# Patient Record
Sex: Male | Born: 1935 | Race: White | Hispanic: No | Marital: Married
Health system: Southern US, Community
[De-identification: ages and names within clinical notes are randomized; demographics above are authoritative.]

---

## 2011-08-29 ENCOUNTER — Ambulatory Visit: Payer: Self-pay | Admitting: Internal Medicine

## 2011-09-20 ENCOUNTER — Inpatient Hospital Stay: Payer: Self-pay | Admitting: Specialist

## 2011-09-29 ENCOUNTER — Ambulatory Visit: Payer: Self-pay | Admitting: Internal Medicine

## 2011-10-30 DEATH — deceased

## 2013-03-30 IMAGING — CT CT HEAD WITHOUT CONTRAST
2 series · 15 of 30 positions shown, 19 images · non-contrast
Comparison: none

REASON FOR EXAM: fall conyusion head
COMMENTS:

[Series 2: without · axial · non-contrast · 0.42mm/px · z∈[+471,+591]mm · 13 of 29 slices shown, 17 images]
[im 3/29  brain]
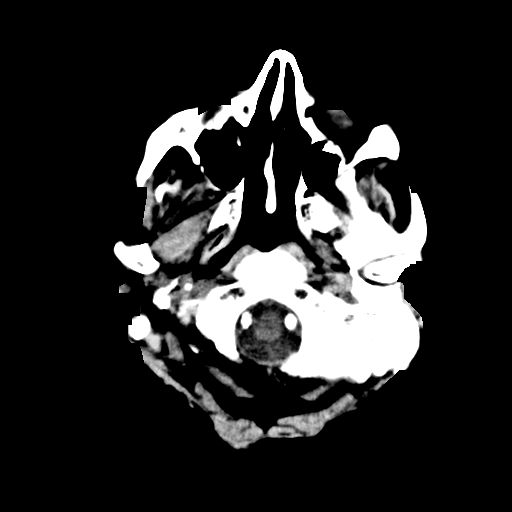
[im 3/29  bone]
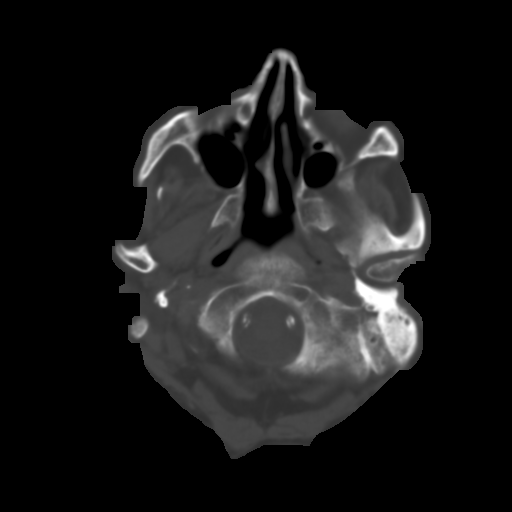
[im 5/29  brain]
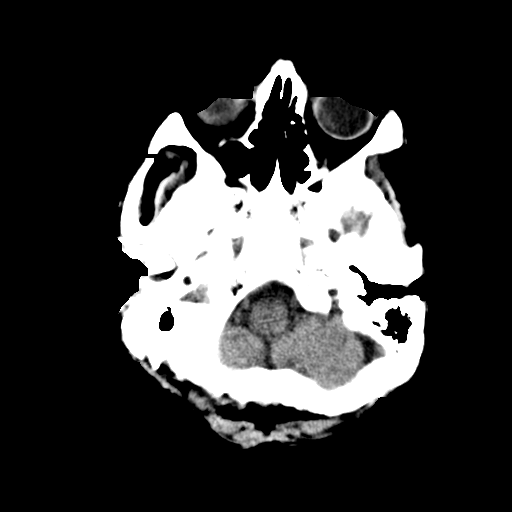
[im 7/29  brain]
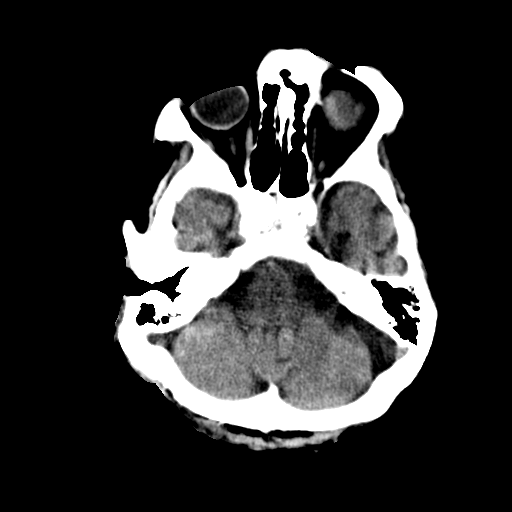
[im 9/29  brain]
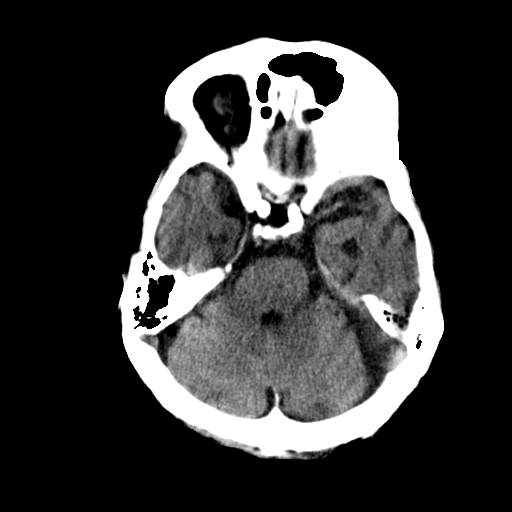
[im 11/29  brain]
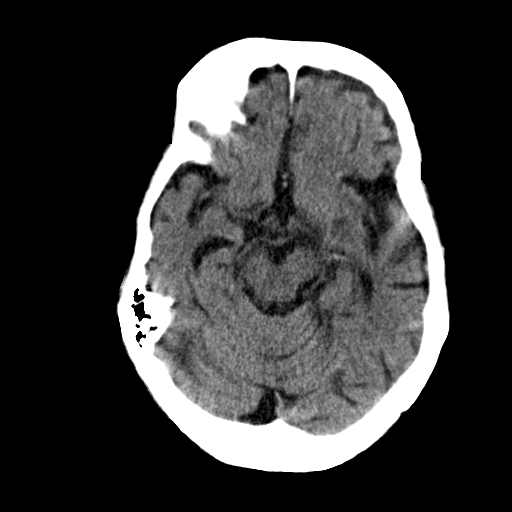
[im 11/29  bone]
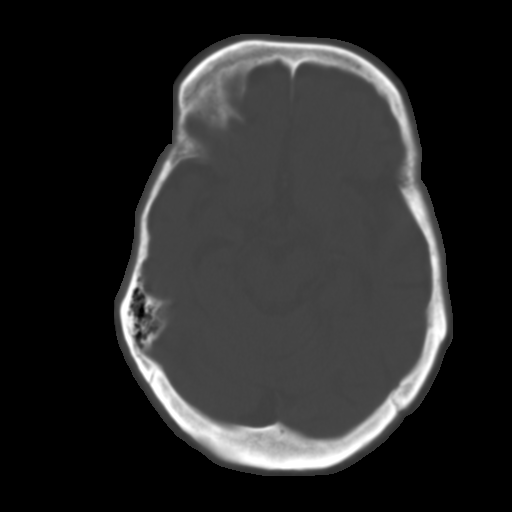
[im 13/29  brain]
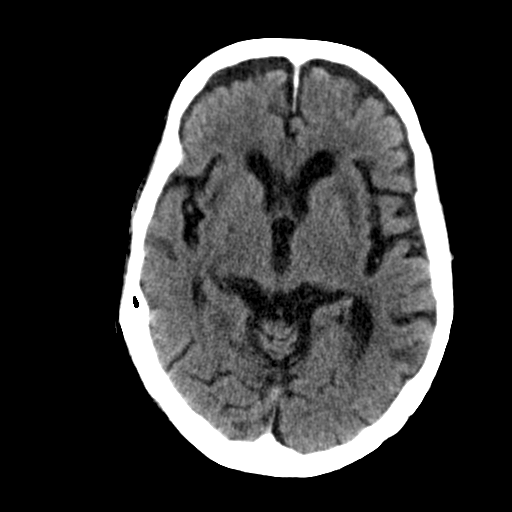
[im 15/29  brain]
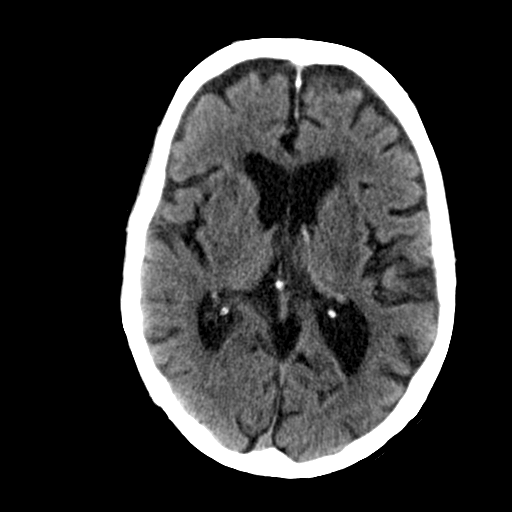
[im 17/29  brain]
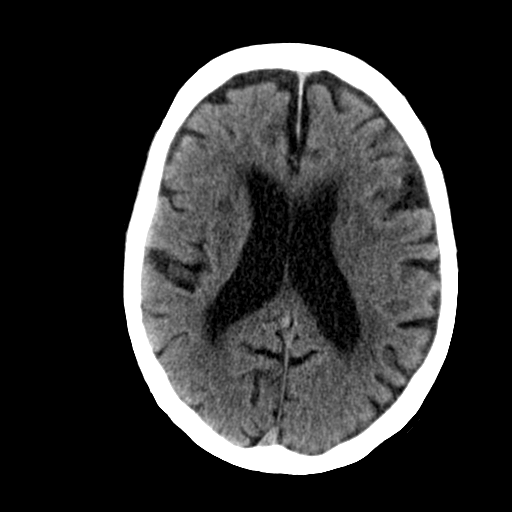
[im 19/29  brain]
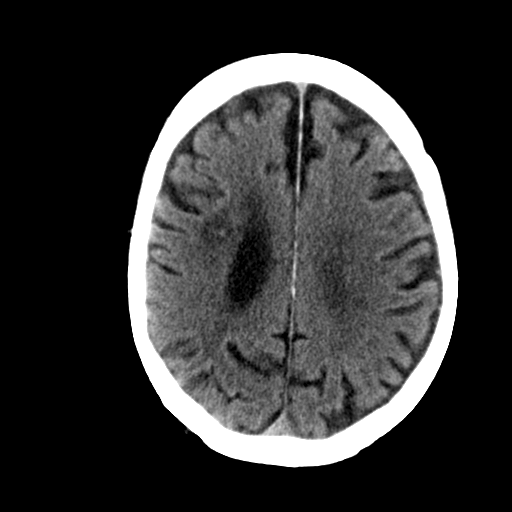
[im 19/29  bone]
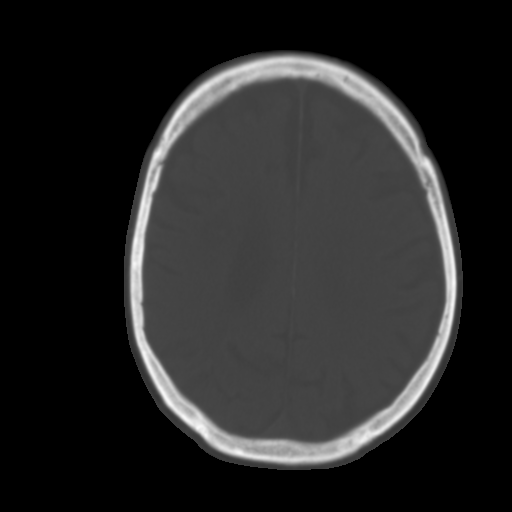
[im 21/29  brain]
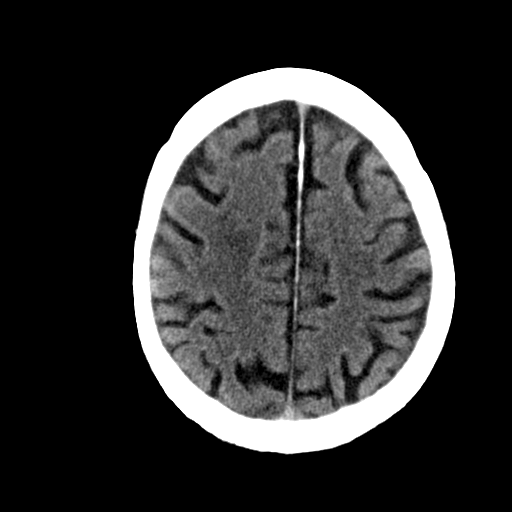
[im 23/29  brain]
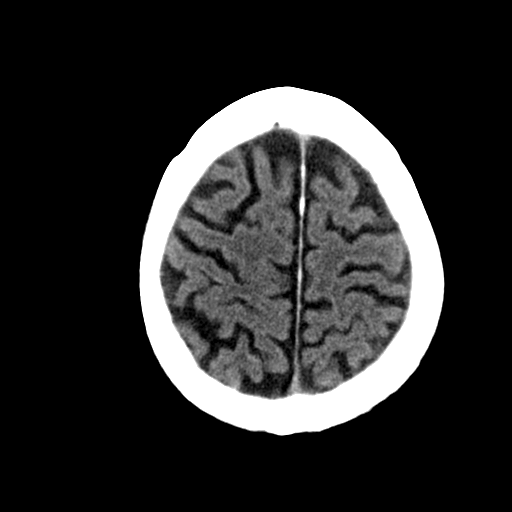
[im 25/29  brain]
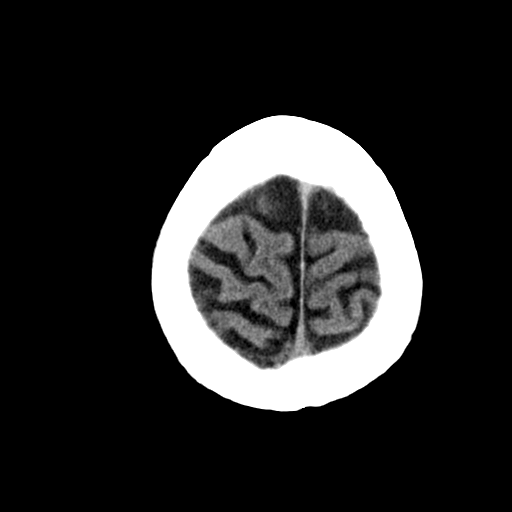
[im 27/29  brain]
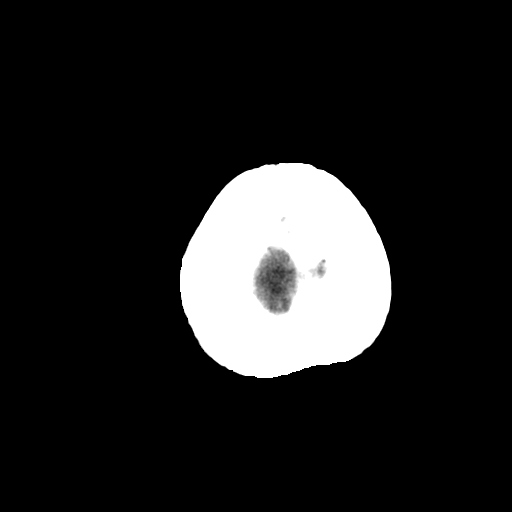
[im 27/29  bone]
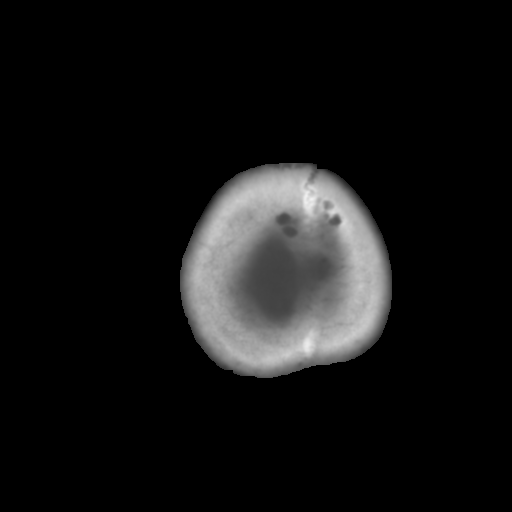

[Series 3: bone · axial · 0.42mm/px · z∈[+471,+491]mm · 2 of 29 slices shown]
[im 3/29  bone]
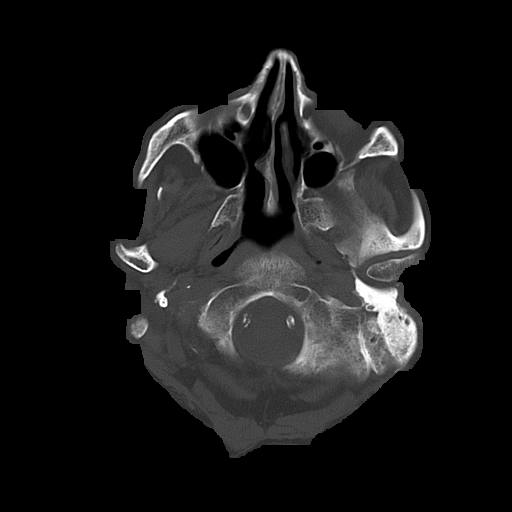
[im 7/29  bone]
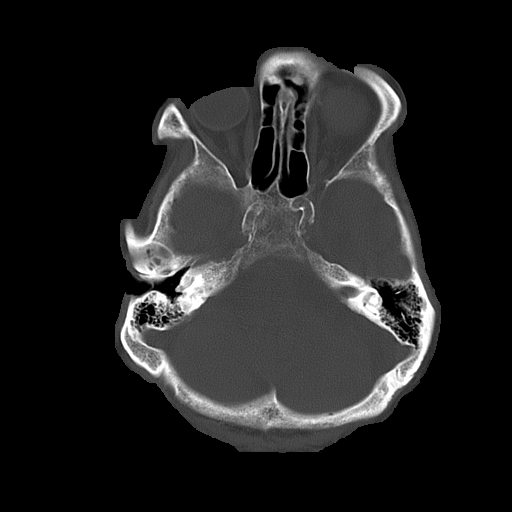

[15 of 30 positions shown; findings below may reference images not displayed]

PROCEDURE:     CT  - CT HEAD WITHOUT CONTRAST  - September 20, 2011  [DATE]

RESULT:     Axial noncontrast CT scanning was performed through the brain
with reconstructions at 5 mm intervals and slice thicknesses.

There is diffuse cerebral and cerebellar atrophy with compensatory
ventriculomegaly. There is no evidence of an acute intracranial hemorrhage.
There is decreased density in the deep white matter of both cerebral
hemispheres, but I do not see evidence of an acute ischemic event. The
cerebellum and brainstem exhibit no acute abnormality. At bone window
settings the observed portions of the paranasal sinuses and mastoid air
cells are clear. There is no evidence of an acute skull fracture.
IMPRESSION: I see no acute intracranial hemorrhage nor objective
evidence of an acute ischemic event. There is decreased density in the deep
white matter of both cerebral hemispheres consistent with chronic small
vessel ischemic type change. This has been described previously on an MRI in
1116. There are extensive chronic changes present. Followup CT scanning or
MRI would be useful if the patient's symptoms persist and remain unexplained.

## 2015-01-20 NOTE — Discharge Summary (Signed)
PATIENT NAME:  Terrence KennerSHE, Fermon H MR#:  [id number] DATE OF BIRTH:  05-17-1911  DATE OF ADMISSION:  09/20/2011 DATE OF DISCHARGE:  09/25/2011  For a detailed note, please see the history and physical on admission.   DIAGNOSES AT DISCHARGE:  1. Hypernatremia secondary to poor p.o. intake.  2. Adult failure to thrive.  3. Dementia.  4. Elevated troponin consistent with a non-ST-elevation myocardial infarction.   CONSULTANT DURING THE HOSPITAL COURSE: Dr. Harriett SineNancy Phifer from Palliative Care   PERTINENT STUDIES DONE DURING THE HOSPITAL COURSE:  1. CT of the head done without contrast on admission showing no acute intracranial hemorrhage. No evidence of any acute ischemic event. 2. Chest x-ray done on admission showing findings consistent with developing pneumonia in the right lower lobe.  3. Echocardiogram done showing an ejection fraction of 25 to 35%, left ventricular systolic function to be moderately severely reduced.   HOSPITAL COURSE: This is a 81 year old male with medical problems as mentioned above who presented to the hospital on December 23rd secondary to weakness, cough, and poor p.o. intake and noted to have a pneumonia. He was also noted to have elevated cardiac markers consistent with non-ST elevation MI.  1. Pneumonia. The patient was admitted to the hospital, started on IV Levaquin, also maintained on some p.r.n. nebulizer treatments. His blood cultures and sputum cultures were negative.  2. Non-ST elevation MI. The patient presented to the hospital with elevated troponin as high as 1.1. It went up to as high as 1.3. He did not present with any chest pain. He had no acute EKG changes. Given his advanced age, he was managed medically with some aspirin, beta-blockers, and statin. Echocardiogram was obtained which showed LV dysfunction, EF of 25 to 35%. He did not have any clinical evidence of heart failure during the hospital course.  3. Dysphagia and poor p.o. intake. Given his advanced age,  a speech consult was obtained. They evaluated the patient and they recommended that the patient was high risk for aspiration and would not recommend feeding him. They did recommend that the family pursue a feeding tube. Given the fact that the patient was 81 years old and had worsening failure to thrive and underlying dementia, Palliative Care consult was obtained to discuss goals of care with the family. Once the patient's family had a discussion with Palliative Care, they did not want any further aggressive measures and the patient was made COMFORT CARE only. Subsequently, the patient was made appropriate for transfer to the [facility]ospice home which is where he was transferred to on 09/25/2011 for further care.   TIME SPENT: 40 minutes.  ____________________________ Rolly PancakeVivek J. Cherlynn KaiserSainani, MD vjs:drc D: 09/25/2011 15:44:17 ET T: 09/27/2011 15:37:11 ET JOB#: [id number]  cc: Rolly PancakeVivek J. Cherlynn KaiserSainani, MD, <Dictator> Houston SirenVIVEK J SAINANI MD ELECTRONICALLY SIGNED 09/29/2011 16:45
